# Patient Record
Sex: Male | Born: 1960 | Race: White | Hispanic: No | Marital: Married | Smoking: Never smoker
Health system: Southern US, Community
[De-identification: ages and names within clinical notes are randomized; demographics above are authoritative.]

## PROBLEM LIST (undated history)

## (undated) DIAGNOSIS — R519 Headache, unspecified: Secondary | ICD-10-CM

## (undated) DIAGNOSIS — R51 Headache: Secondary | ICD-10-CM

## (undated) HISTORY — PX: UMBILICAL HERNIA REPAIR: SHX196

## (undated) HISTORY — PX: MICRODISCECTOMY LUMBAR: SUR864

---

## 1998-05-17 ENCOUNTER — Observation Stay (HOSPITAL_COMMUNITY): Admission: RE | Admit: 1998-05-17 | Discharge: 1998-05-17 | Payer: Self-pay | Admitting: Neurosurgery

## 2009-11-26 HISTORY — PX: INGUINAL HERNIA REPAIR: SUR1180

## 2010-02-08 ENCOUNTER — Ambulatory Visit: Payer: Self-pay | Admitting: Surgery

## 2010-02-15 ENCOUNTER — Ambulatory Visit: Payer: Self-pay | Admitting: Surgery

## 2013-04-10 ENCOUNTER — Ambulatory Visit: Payer: Self-pay | Admitting: Emergency Medicine

## 2015-02-15 ENCOUNTER — Ambulatory Visit: Payer: Self-pay | Admitting: Surgery

## 2015-02-24 ENCOUNTER — Ambulatory Visit: Admit: 2015-02-24 | Disposition: A | Discharge status: Home / Self Care | Payer: Self-pay | Admitting: Surgery

## 2015-03-03 ENCOUNTER — Ambulatory Visit
Admit: 2015-03-03 | Disposition: A | Discharge status: Home / Self Care | Payer: Self-pay | Attending: Surgery | Admitting: Surgery

## 2015-03-15 ENCOUNTER — Ambulatory Visit
Admit: 2015-03-15 | Disposition: A | Discharge status: Home / Self Care | Payer: Self-pay | Attending: Internal Medicine | Admitting: Internal Medicine

## 2015-03-21 ENCOUNTER — Ambulatory Visit
Admit: 2015-03-21 | Disposition: A | Discharge status: Home / Self Care | Payer: Self-pay | Attending: Family Medicine | Admitting: Family Medicine

## 2015-03-21 LAB — URINALYSIS, COMPLETE
Bacteria: NEGATIVE
Bilirubin,UR: NEGATIVE
Glucose,UR: NEGATIVE
KETONE: NEGATIVE
NITRITE: NEGATIVE
Ph: 7 (ref 5.0–8.0)
SPECIFIC GRAVITY: 1.02 (ref 1.000–1.030)

## 2015-03-23 LAB — URINE CULTURE

## 2015-03-27 NOTE — H&P (Signed)
PATIENT NAME:  Billy Crosby, Billy Crosby MR#:  [id number] DATE OF BIRTH:  [birth year]  DATE OF ADMISSION:  03/03/2015  CHIEF COMPLAINT: Recurrent ventral hernia.   HISTORY OF PRESENT ILLNESS: This is a patient who had a ventral hernia repaired in the past with Atrium mesh. It has recurred. He has had symptoms for about 2 years with pain and bulge and is requesting repair.   PAST MEDICAL HISTORY: Benign prostatic hypertrophy and allergies.   PAST SURGICAL HISTORY: Right lower hernia repair and umbilical hernia repair.   MEDICATIONS: Multiple, see reconciliation.   ALLERGIES: LEVOFLOXACIN.   SOCIAL HISTORY: The patient rarely drinks alcohol. Does not smoke.   REVIEW OF SYSTEMS: A complete system review is documented in the office chart.   PHYSICAL EXAMINATION:  GENERAL: Healthy-appearing nonobese, male patient.  HEENT: No scleral icterus.  NECK: No palpable neck nodes.  CHEST: Clear to auscultation.  CARDIAC: Regular rate and rhythm.  ABDOMEN: Soft and nontender. There is a scar around the umbilicus and a recurrent hernia cephalad to the umbilicus which is reducible and nontender.  EXTREMITIES: Without edema.  NEUROLOGIC: Grossly intact.  INTEGUMENT: No jaundice.   LABORATORY VALUES: Demonstrate a hemoglobin and hematocrit of 15 and 44 and a platelet count 212,000. Electrolytes are within normal limits.   ASSESSMENT AND PLAN: This is a patient with a recurrent ventral hernia. He is here for repair. The rationale for this has been discussed. The options of observation have been reviewed. I reviewed Dr. Lavone Neri Smith's prior utilizing Atrium mesh, which has obviously recurred. I would plan on possibly removing this mesh versus reinforcing it. The rationale for this has been discussed. The options of observation have been reviewed. The risks of bleeding, infection, mesh placement, mesh infection and bowel injury been reviewed as well. He understood and agreed to proceed.    ____________________________ Jerrol Banana Burt Knack, MD rec:AT D: 03/02/2015 15:03:00 ET T: 03/02/2015 15:39:59 ET JOB#: [id number]  cc: Jerrol Banana. Burt Knack, MD, <Dictator> Florene Glen MD ELECTRONICALLY SIGNED 03/03/2015 7:38

## 2015-03-27 NOTE — Op Note (Signed)
PATIENT NAME:  Billy Crosby, Billy Crosby MR#:  [id number] DATE OF BIRTH:  1961-11-20  DATE OF PROCEDURE:  03/03/2015  PREOPERATIVE DIAGNOSIS: Recurrent ventral hernia.   POSTOPERATIVE DIAGNOSIS:  Recurrent ventral hernia.  PROCEDURE:  Repair of recurrent ventral hernia with mesh.   SURGEON: Richard E. Burt Knack, M.D.   ANESTHESIA: General with endotracheal tube.   INDICATIONS: This patient has had a prior umbilical hernia repaired in the past with mesh. Preoperatively, we discussed rationale for surgery, the risks of bleeding, infection, mesh placement, mesh infection, cosmetic deformity, and recurrence. This was all reviewed for him in the preop holding area. He understood and agreed to proceed.  It was our understanding after reviewing the previous operative report that Atrium mesh had been utilized at the periumbilical site.   FINDINGS: Multiloculated incarcerated omentum in ventral hernia recurrence.  The hernial rent itself measured 2 cm and an 8.3 cm Ventralex ST mesh was utilized in the intraabdominal component.   No bowel was involved in this, but multiloculated incarceration of omentum was present.   DESCRIPTION OF PROCEDURE: The patient was induced under general anesthesia. He was given IV antibiotics. VTE prophylaxis was in place. He was prepped and draped in a sterile fashion. Marcaine was infiltrated in skin and subcutaneous tissues around the palpable and visible incarceration.  A midline incision was utilized to open and explore the subcutaneous tissues.  Large multiloculated sacs were identified. These were opened and cleaned at the fascial edges and ultimately reduced. No mesh was palpable nor visible in this area, but suture was present.  Once the incarceration had been completely reduced and the fascial edges were cleaned, the fascia was noted to be fairly attenuated, but because of the 2 cm rent an 8.3 cm Ventralex ST was placed into the abdominal cavity sutured in with U sutures of 0  Prolene.  Fascia was closed over the mesh after trimming the tails on the mesh. This was done with 0 Prolene as well.  Additional Marcaine was placed for a total of 30 mL, and then the wound was closed in layers, 3 layers of 2-0 and 3-0 Vicryl and then skin staples were placed and a sterile dressing was placed.   The patient tolerated the procedure well. There were no complications. He was taken to the recovery room in stable condition to be discharged to the care of his family. Followup in 10 days. Sponge, lap, needle counts were correct.  Estimated blood loss was nil.   ____________________________ Billy Crosby. Burt Knack, MD rec:sp D: 03/03/2015 10:42:38 ET T: 03/03/2015 13:09:20 ET JOB#: [id number]  cc: Billy Crosby. Burt Knack, MD, <Dictator> Florene Glen MD ELECTRONICALLY SIGNED 03/03/2015 18:41

## 2016-05-05 IMAGING — US US EXTREM LOW VENOUS*L*
1 series · 14 of 24 positions shown · non-contrast
Comparison: None.

CLINICAL DATA: Left calf pain

EXAM:
LEFT LOWER EXTREMITY VENOUS DUPLEX ULTRASOUND
TECHNIQUE: Doppler venous assessment of the left lower extremity deep venous
system was performed, including characterization of spectral flow,
compressibility, and phasicity.

[Series 1: us extrem low venous*left* · 0.09mm/px · 14 of 40 slices shown]
[im 1/40]
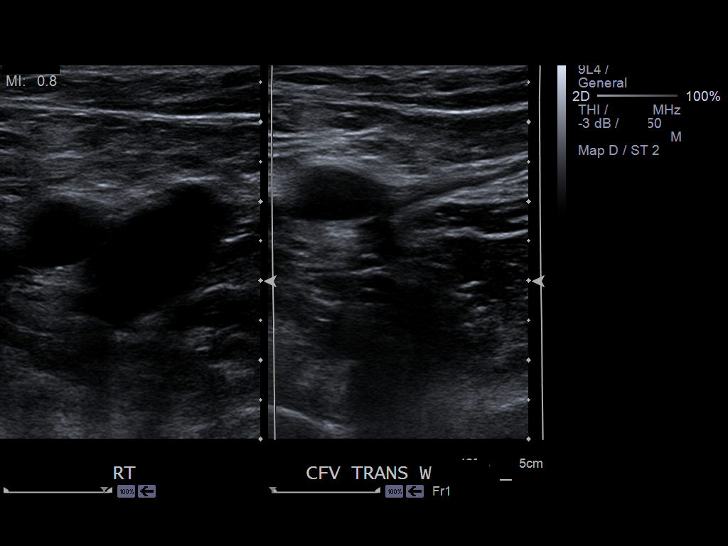
[im 4/40]
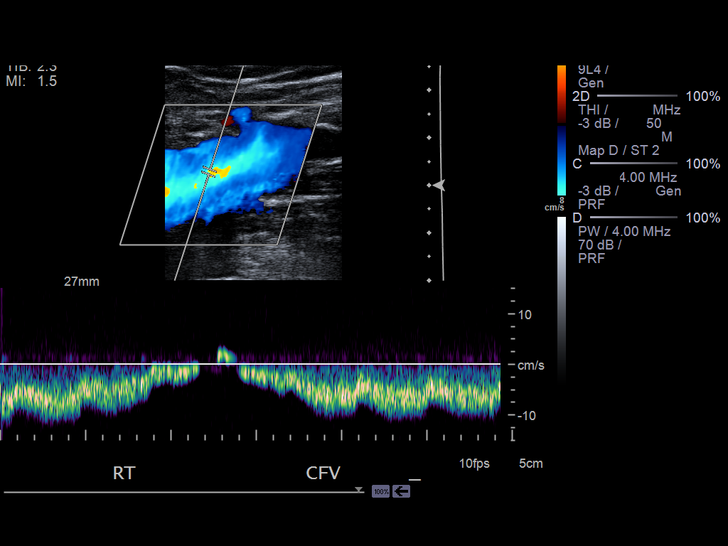
[im 7/40]
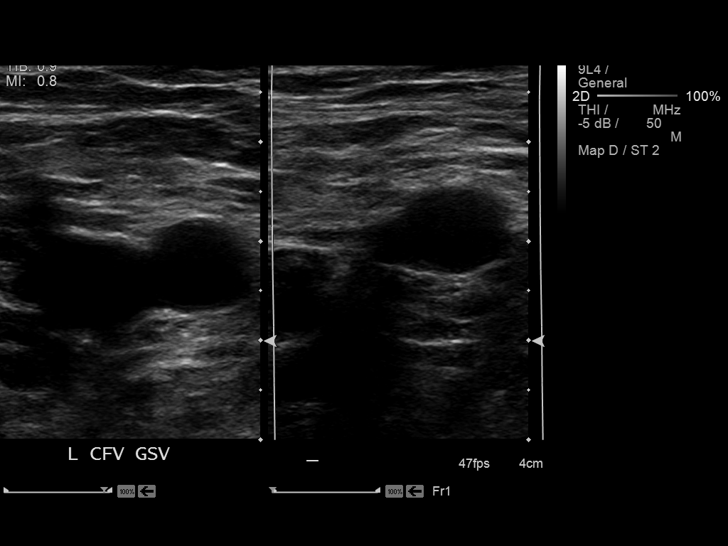
[im 11/40]
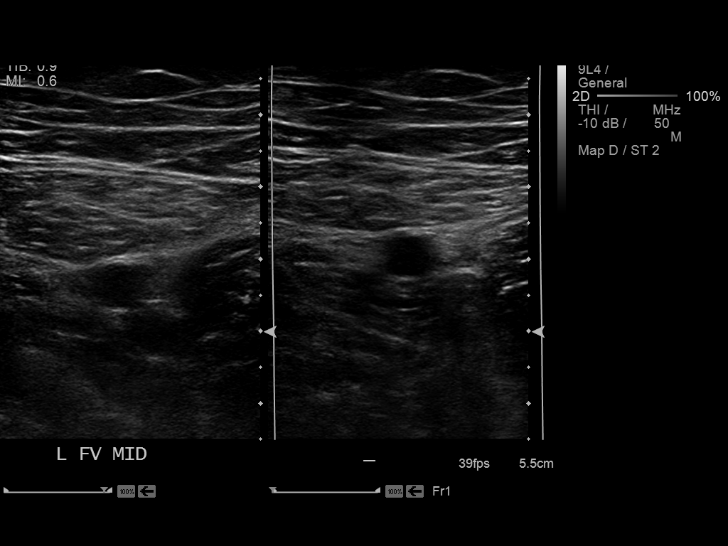
[im 12/40]
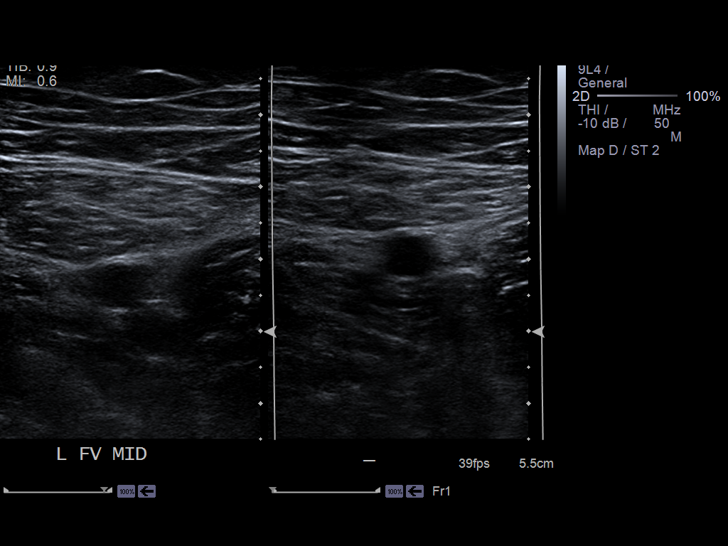
[im 16/40]
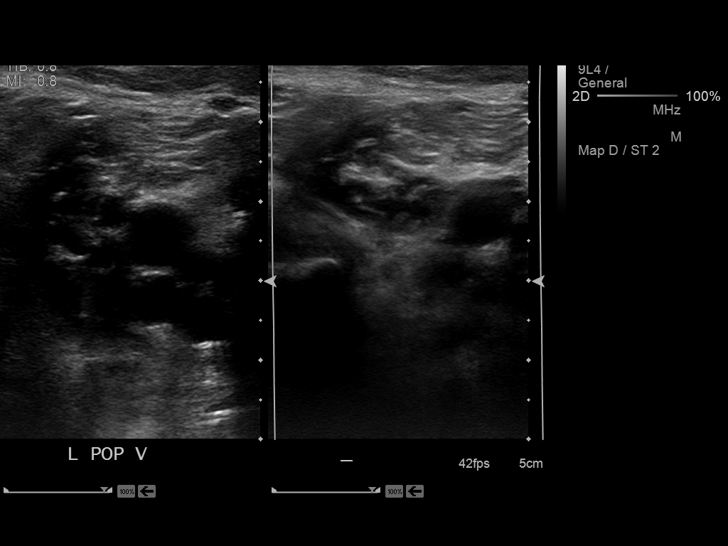
[im 19/40]
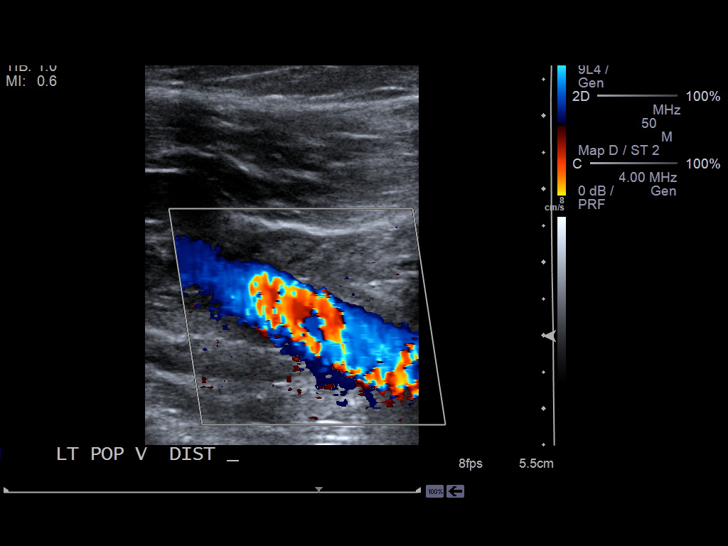
[im 21/40]
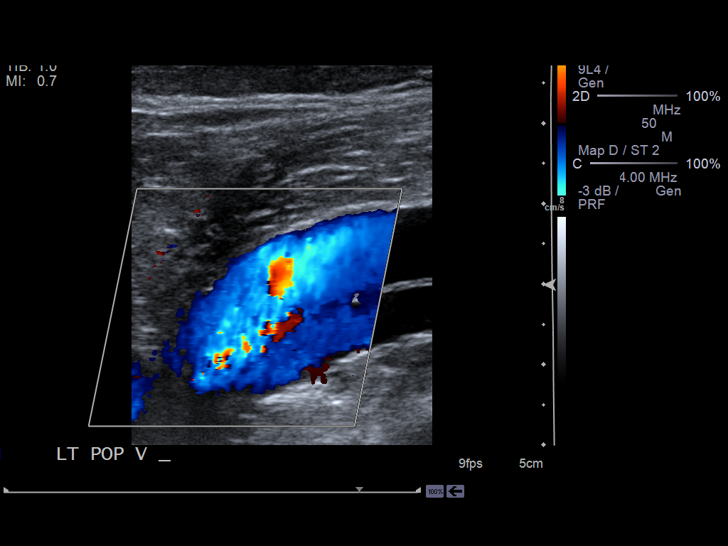
[im 24/40]
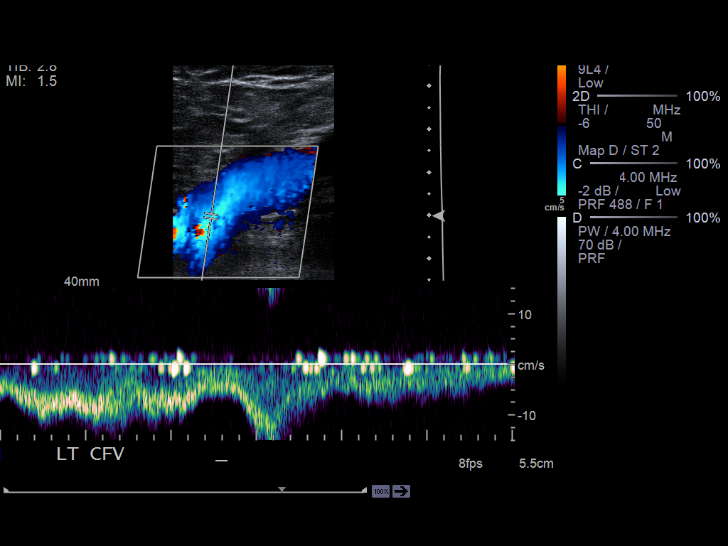
[im 28/40]
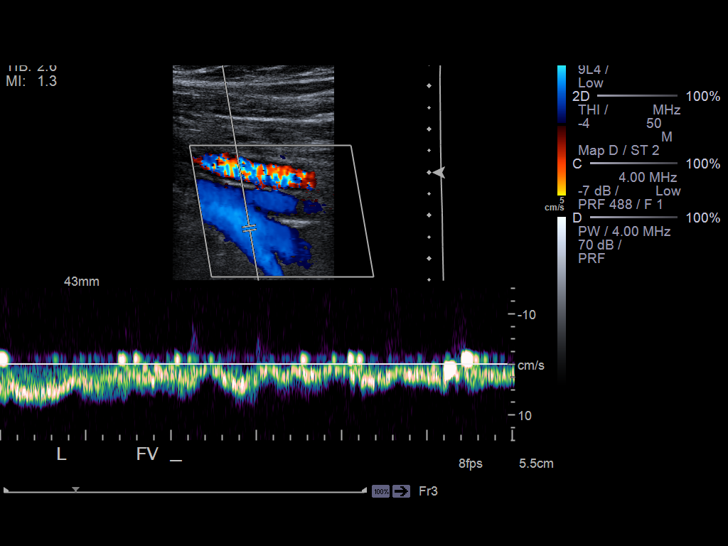
[im 31/40]
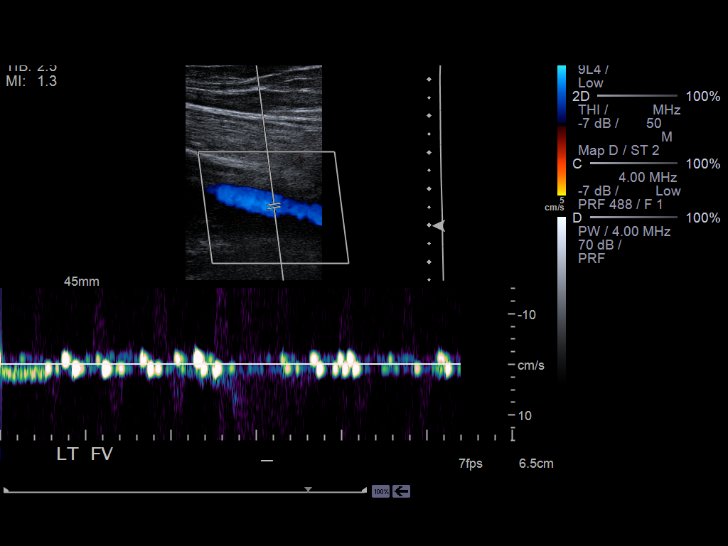
[im 33/40]
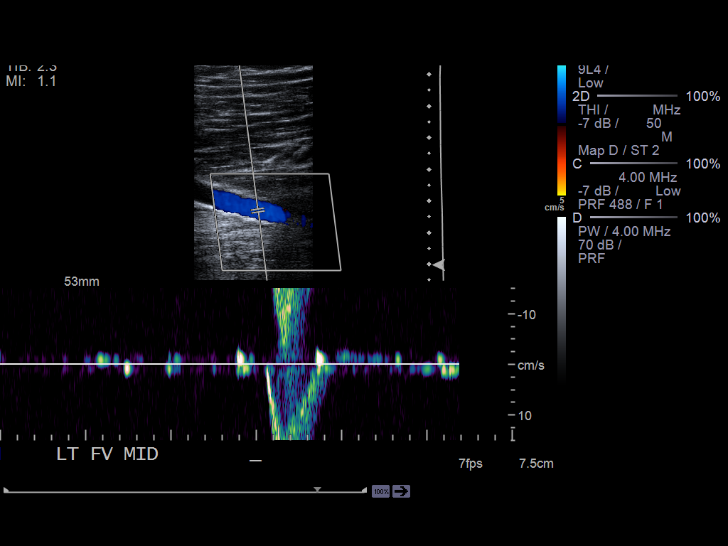
[im 36/40]
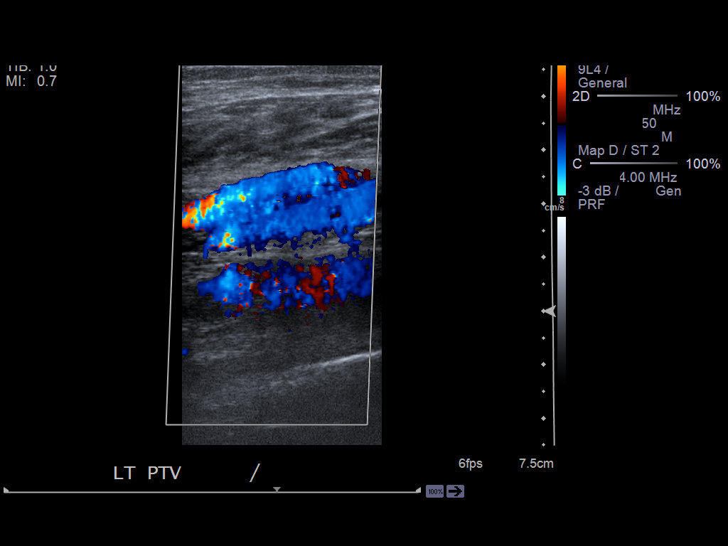
[im 40/40]
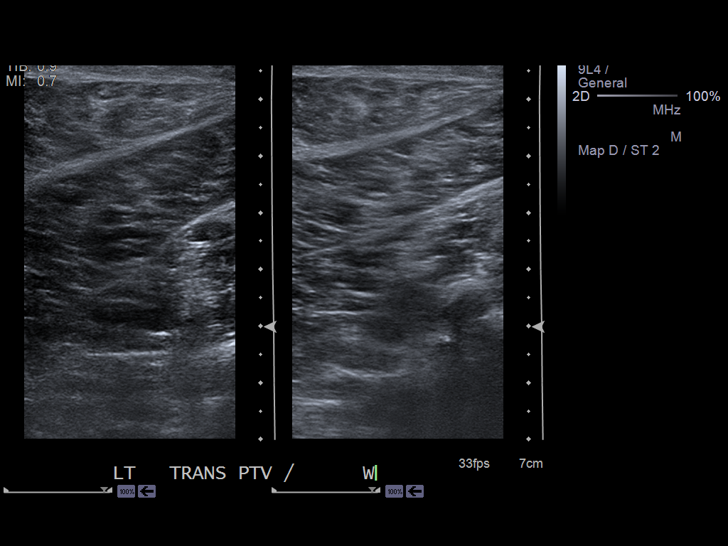

[14 of 24 positions shown; findings below may reference images not displayed]

FINDINGS: There is complete compressibility of the left common femoral,
femoral, and popliteal veins. Doppler analysis demonstrates
phasicity and augmentation. No calf DVT.
IMPRESSION: No evidence of DVT in the left lower extremity.

## 2016-06-26 ENCOUNTER — Telehealth: Payer: Self-pay | Admitting: Gastroenterology

## 2016-06-26 ENCOUNTER — Other Ambulatory Visit: Payer: Self-pay

## 2016-06-26 NOTE — Telephone Encounter (Signed)
Gastroenterology Pre-Procedure Review  Request Date: 08/23/2016 Requesting Physician:   PATIENT REVIEW QUESTIONS: The patient responded to the following health history questions as indicated:    1. Are you having any GI issues? no 2. Do you have a personal history of Polyps? no 3. Do you have a family history of Colon Cancer or Polyps? no 4. Diabetes Mellitus? no 5. Joint replacements in the past 12 months?no 6. Major health problems in the past 3 months?no 7. Any artificial heart valves, MVP, or defibrillator?no    MEDICATIONS & ALLERGIES:    Patient reports the following regarding taking any anticoagulation/antiplatelet therapy:   Plavix, Coumadin, Eliquis, Xarelto, Lovenox, Pradaxa, Brilinta, or Effient? no Aspirin? no  Patient confirms/reports the following medications:  Current Outpatient Prescriptions  Medication Sig Dispense Refill  . fluticasone (FLONASE) 50 MCG/ACT nasal spray Place into both nostrils daily.    Marland Kitchen topiramate (TOPAMAX) 25 MG tablet Take 25 mg by mouth 2 (two) times daily.     No current facility-administered medications for this visit.     Patient confirms/reports the following allergies:  Allergies  Allergen Reactions  . Levaquin [Levofloxacin] Swelling    Tongue and mouth swells     No orders of the defined types were placed in this encounter.   AUTHORIZATION INFORMATION Primary Insurance: 1D#: Group #:  Secondary Insurance: 1D#: Group #:  SCHEDULE INFORMATION: Date: 08/23/2016 Time: Location: [facility]

## 2016-06-26 NOTE — Telephone Encounter (Signed)
colonoscopy

## 2016-06-26 NOTE — Telephone Encounter (Signed)
Screening Colonoscopy Z12.11 [facility] 0000000 Please pre cert

## 2016-08-16 ENCOUNTER — Encounter: Payer: Self-pay | Admitting: *Deleted

## 2016-08-21 NOTE — Discharge Instructions (Signed)

## 2016-08-23 ENCOUNTER — Encounter: Payer: Self-pay | Admitting: *Deleted

## 2016-08-23 ENCOUNTER — Ambulatory Visit
Admission: RE | Admit: 2016-08-23 | Discharge: 2016-08-23 | Disposition: A | Discharge status: Home / Self Care | Payer: Managed Care, Other (non HMO) | Source: Ambulatory Visit | Attending: Gastroenterology | Admitting: Gastroenterology

## 2016-08-23 ENCOUNTER — Ambulatory Visit: Payer: Managed Care, Other (non HMO) | Admitting: Anesthesiology

## 2016-08-23 ENCOUNTER — Encounter
Admission: RE | Disposition: A | Discharge status: Home / Self Care | Payer: Self-pay | Source: Ambulatory Visit | Attending: Gastroenterology

## 2016-08-23 DIAGNOSIS — D123 Benign neoplasm of transverse colon: Secondary | ICD-10-CM

## 2016-08-23 DIAGNOSIS — K648 Other hemorrhoids: Secondary | ICD-10-CM | POA: Diagnosis not present

## 2016-08-23 DIAGNOSIS — Z1211 Encounter for screening for malignant neoplasm of colon: Secondary | ICD-10-CM

## 2016-08-23 HISTORY — PX: POLYPECTOMY: SHX5525

## 2016-08-23 HISTORY — DX: Headache, unspecified: R51.9

## 2016-08-23 HISTORY — DX: Headache: R51

## 2016-08-23 HISTORY — PX: COLONOSCOPY WITH PROPOFOL: SHX5780

## 2016-08-23 SURGERY — COLONOSCOPY WITH PROPOFOL
Anesthesia: Monitor Anesthesia Care | Wound class: Contaminated

## 2016-08-23 MED ORDER — LIDOCAINE HCL (CARDIAC) 20 MG/ML IV SOLN
INTRAVENOUS | Status: DC | PRN
  Administered 2016-08-23: 50 mg via INTRAVENOUS

## 2016-08-23 MED ORDER — OXYCODONE HCL 5 MG PO TABS: 5.0000 mg | ORAL_TABLET | Freq: Once | ORAL | Status: DC | PRN

## 2016-08-23 MED ORDER — PROPOFOL 10 MG/ML IV BOLUS
INTRAVENOUS | Status: DC | PRN
  Administered 2016-08-23: 10 mg via INTRAVENOUS
  Administered 2016-08-23 (×2): 20 mg via INTRAVENOUS
  Administered 2016-08-23: 60 mg via INTRAVENOUS
  Administered 2016-08-23 (×2): 20 mg via INTRAVENOUS

## 2016-08-23 MED ORDER — LACTATED RINGERS IV SOLN
INTRAVENOUS | Status: DC
  Administered 2016-08-23: 07:00:00 via INTRAVENOUS

## 2016-08-23 MED ORDER — OXYCODONE HCL 5 MG/5ML PO SOLN: 5.0000 mg | Freq: Once | ORAL | Status: DC | PRN

## 2016-08-23 MED ORDER — STERILE WATER FOR IRRIGATION IR SOLN
Status: DC | PRN
  Administered 2016-08-23: 08:00:00

## 2016-08-23 SURGICAL SUPPLY — 23 items
CANISTER SUCT 1200ML W/VALVE (MISCELLANEOUS) ×4 IMPLANT
CLIP HMST 235XBRD CATH ROT (MISCELLANEOUS) IMPLANT
CLIP RESOLUTION 360 11X235 (MISCELLANEOUS)
FCP ESCP3.2XJMB 240X2.8X (MISCELLANEOUS)
FORCEPS BIOP RAD 4 LRG CAP 4 (CUTTING FORCEPS) ×2 IMPLANT
FORCEPS BIOP RJ4 240 W/NDL (MISCELLANEOUS)
FORCEPS ESCP3.2XJMB 240X2.8X (MISCELLANEOUS) IMPLANT
GOWN CVR UNV OPN BCK APRN NK (MISCELLANEOUS) ×4 IMPLANT
GOWN ISOL THUMB LOOP REG UNIV (MISCELLANEOUS) ×8
INJECTOR VARIJECT VIN23 (MISCELLANEOUS) IMPLANT
KIT DEFENDO VALVE AND CONN (KITS) IMPLANT
KIT ENDO PROCEDURE OLY (KITS) ×4 IMPLANT
MARKER SPOT ENDO TATTOO 5ML (MISCELLANEOUS) IMPLANT
PAD GROUND ADULT SPLIT (MISCELLANEOUS) IMPLANT
PROBE APC STR FIRE (PROBE) IMPLANT
RETRIEVER NET ROTH 2.5X230 LF (MISCELLANEOUS) IMPLANT
SNARE SHORT THROW 13M SML OVAL (MISCELLANEOUS) IMPLANT
SNARE SHORT THROW 30M LRG OVAL (MISCELLANEOUS) IMPLANT
SNARE SNG USE RND 15MM (INSTRUMENTS) IMPLANT
SPOT EX ENDOSCOPIC TATTOO (MISCELLANEOUS)
TRAP ETRAP POLY (MISCELLANEOUS) IMPLANT
VARIJECT INJECTOR VIN23 (MISCELLANEOUS)
WATER STERILE IRR 250ML POUR (IV SOLUTION) ×4 IMPLANT

## 2016-08-23 NOTE — Op Note (Signed)
[facility]astroenterology Patient Name: Billy Crosby Procedure Date: 08/23/2016 7:55 AM MRN: [id number] Account #: [id number] Date of Birth: 22-Aug-1961 Admit Type: Outpatient Age: 55 Room: [facility] OR ROOM 01 Gender: Male Note Status: Finalized Procedure:            Colonoscopy Indications:          Screening for colorectal malignant neoplasm Providers:            Lucilla Lame MD, MD Referring MD:         Leona Carry. Hall Busing, MD (Referring MD) Medicines:            Propofol per Anesthesia Complications:        No immediate complications. Procedure:            Pre-Anesthesia Assessment:                       - Prior to the procedure, a History and Physical was                        performed, and patient medications and allergies were                        reviewed. The patient's tolerance of previous                        anesthesia was also reviewed. The risks and benefits of                        the procedure and the sedation options and risks were                        discussed with the patient. All questions were                        answered, and informed consent was obtained. Prior                        Anticoagulants: The patient has taken no previous                        anticoagulant or antiplatelet agents. ASA Grade                        Assessment: II - A patient with mild systemic disease.                        After reviewing the risks and benefits, the patient was                        deemed in satisfactory condition to undergo the                        procedure.                       After obtaining informed consent, the colonoscope was                        passed under direct vision. Throughout the procedure,  the patient's blood pressure, pulse, and oxygen                        saturations were monitored continuously. The was                        introduced through the anus and advanced to the the                         cecum, identified by appendiceal orifice and ileocecal                        valve. The colonoscopy was performed without                        difficulty. The patient tolerated the procedure well.                        The quality of the bowel preparation was good. Findings:      The perianal and digital rectal examinations were normal.      A 4 mm polyp was found in the transverse colon. The polyp was sessile.       The polyp was removed with a cold biopsy forceps. Resection and       retrieval were complete.      Non-bleeding internal hemorrhoids were found during retroflexion. The       hemorrhoids were Grade I (internal hemorrhoids that do not prolapse). Impression:           - One 4 mm polyp in the transverse colon, removed with                        a cold biopsy forceps. Resected and retrieved.                       - Non-bleeding internal hemorrhoids. Recommendation:       - Await pathology results.                       - Discharge patient to home.                       - Resume previous diet.                       - Continue present medications. Procedure Code(s):    --- Professional ---                       [phone], Colonoscopy, flexible; with biopsy, single or                        multiple Diagnosis Code(s):    --- Professional ---                       Z12.11, Encounter for screening for malignant neoplasm                        of colon                       D12.3, Benign neoplasm of transverse colon (hepatic  flexure or splenic flexure) CPT copyright 2016 American Medical Association. All rights reserved. The codes documented in this report are preliminary and upon coder review may  be revised to meet current compliance requirements. Lucilla Lame MD, MD 08/23/2016 8:15:30 AM This report has been signed electronically. Number of Addenda: 0 Note Initiated On: 08/23/2016 7:55 AM Scope Withdrawal Time: 0 hours 8 minutes 28 seconds   Total Procedure Duration: 0 hours 10 minutes 40 seconds       [facility]

## 2016-08-23 NOTE — H&P (Signed)
  Billy Lame, MD [facility] [address], [place], [place] [address] Phone: [phone] Fax : [phone]  Primary Care Physician:  Albina Billet, MD Primary Gastroenterologist:  Dr. Allen Norris  Pre-Procedure History & Physical: HPI:  Billy Crosby is a 55 y.o. male is here for a screening colonoscopy.   Past Medical History:  Diagnosis Date  . Headache    Tension/sinus/stress 6-7x/wk    Past Surgical History:  Procedure Laterality Date  . INGUINAL HERNIA REPAIR Right 2011  . MICRODISCECTOMY LUMBAR  1990s   L4-L5 (x2)  . UMBILICAL HERNIA REPAIR  2011, 2016    Prior to Admission medications   Medication Sig Start Date End Date Taking? Authorizing Provider  azelastine (ASTELIN) 0.1 % nasal spray Place 1 spray into both nostrils 2 (two) times daily. Use in each nostril as directed   Yes Historical Provider, MD  Cholecalciferol (VITAMIN D3 PO) Take by mouth.   Yes Historical Provider, MD  fluticasone (FLONASE) 50 MCG/ACT nasal spray Place into both nostrils daily.   Yes Historical Provider, MD  GARLIC OIL PO Take by mouth.   Yes Historical Provider, MD  Menaquinone-7 (VITAMIN K2 PO) Take by mouth.   Yes Historical Provider, MD  NON FORMULARY "Skull cap" (Scutellarai Lateriaflora) for headaches PRN   Yes Historical Provider, MD  OIL OF OREGANO PO Take by mouth.   Yes Historical Provider, MD  Probiotic Product (PROBIOTIC PO) Take by mouth.   Yes Historical Provider, MD  topiramate (TOPAMAX) 25 MG tablet Take 25 mg by mouth 2 (two) times daily.   Yes Historical Provider, MD    Allergies as of 06/26/2016 - never reviewed  Allergen Reaction Noted  . Levaquin [levofloxacin] Swelling 06/26/2016    History reviewed. No pertinent family history.  Social History   Social History  . Marital status: Married    Spouse name: N/A  . Number of children: N/A  . Years of education: N/A   Occupational History  . Not on file.   Social History Main Topics  . Smoking status:  Never Smoker  . Smokeless tobacco: Never Used  . Alcohol use Yes     Comment: Holidays  . Drug use: Unknown  . Sexual activity: Not on file   Other Topics Concern  . Not on file   Social History Narrative  . No narrative on file    Review of Systems: See HPI, otherwise negative ROS  Physical Exam: BP 120/82   Pulse 93   Temp 97.5 F (36.4 C)   Resp 16   Ht 5\' 11"  (1.803 m)   Wt 220 lb (99.8 kg)   SpO2 98%   BMI 30.68 kg/m  General:   Alert,  pleasant and cooperative in NAD Head:  Normocephalic and atraumatic. Neck:  Supple; no masses or thyromegaly. Lungs:  Clear throughout to auscultation.    Heart:  Regular rate and rhythm. Abdomen:  Soft, nontender and nondistended. Normal bowel sounds, without guarding, and without rebound.   Neurologic:  Alert and  oriented x4;  grossly normal neurologically.  Impression/Plan: Billy Crosby is now here to undergo a screening colonoscopy.  Risks, benefits, and alternatives regarding colonoscopy have been reviewed with the patient.  Questions have been answered.  All parties agreeable.

## 2016-08-23 NOTE — Transfer of Care (Signed)
Immediate Anesthesia Transfer of Care Note  Patient: Billy Crosby  Procedure(s) Performed: Procedure(s) with comments: COLONOSCOPY WITH PROPOFOL (N/A) - requests early POLYPECTOMY  Patient Location: PACU  Anesthesia Type: MAC  Level of Consciousness: awake, alert  and patient cooperative  Airway and Oxygen Therapy: Patient Spontanous Breathing and Patient connected to supplemental oxygen  Post-op Assessment: Post-op Vital signs reviewed, Patient's Cardiovascular Status Stable, Respiratory Function Stable, Patent Airway and No signs of Nausea or vomiting  Post-op Vital Signs: Reviewed and stable  Complications: No apparent anesthesia complications

## 2016-08-23 NOTE — Anesthesia Postprocedure Evaluation (Signed)
Anesthesia Post Note  Patient: Billy Crosby  Procedure(s) Performed: Procedure(s) (LRB): COLONOSCOPY WITH PROPOFOL (N/A) POLYPECTOMY  Patient location during evaluation: PACU Anesthesia Type: MAC Level of consciousness: awake and alert Pain management: pain level controlled Vital Signs Assessment: post-procedure vital signs reviewed and stable Respiratory status: spontaneous breathing, nonlabored ventilation, respiratory function stable and patient connected to nasal cannula oxygen Cardiovascular status: stable and blood pressure returned to baseline Anesthetic complications: no    Nautia Lem

## 2016-08-23 NOTE — Anesthesia Procedure Notes (Signed)
Procedure Name: MAC Performed by: Monserratt Knezevic Pre-anesthesia Checklist: Patient identified, Emergency Drugs available, Suction available, Timeout performed and Patient being monitored Patient Re-evaluated:Patient Re-evaluated prior to inductionOxygen Delivery Method: Nasal cannula Placement Confirmation: positive ETCO2       

## 2016-08-23 NOTE — Anesthesia Preprocedure Evaluation (Signed)
Anesthesia Evaluation  Patient identified by MRN, date of birth, ID band  Reviewed: NPO status   History of Anesthesia Complications Negative for: history of anesthetic complications  Airway Mallampati: II  TM Distance: >3 FB Neck ROM: full    Dental  (+) Chipped,    Pulmonary neg pulmonary ROS,    Pulmonary exam normal        Cardiovascular Exercise Tolerance: Good negative cardio ROS Normal cardiovascular exam     Neuro/Psych  Headaches, negative psych ROS   GI/Hepatic negative GI ROS, Neg liver ROS,   Endo/Other  negative endocrine ROS  Renal/GU negative Renal ROS  negative genitourinary   Musculoskeletal   Abdominal   Peds  Hematology negative hematology ROS (+)   Anesthesia Other Findings   Reproductive/Obstetrics                             Anesthesia Physical Anesthesia Plan  ASA: II  Anesthesia Plan: MAC   Post-op Pain Management:    Induction:   Airway Management Planned:   Additional Equipment:   Intra-op Plan:   Post-operative Plan:   Informed Consent: I have reviewed the patients History and Physical, chart, labs and discussed the procedure including the risks, benefits and alternatives for the proposed anesthesia with the patient or authorized representative who has indicated his/her understanding and acceptance.     Plan Discussed with: CRNA  Anesthesia Plan Comments:         Anesthesia Quick Evaluation

## 2016-08-24 ENCOUNTER — Encounter: Payer: Self-pay | Admitting: Gastroenterology

## 2016-08-28 ENCOUNTER — Encounter: Payer: Self-pay | Admitting: Gastroenterology

## 2016-09-04 ENCOUNTER — Encounter: Payer: Self-pay | Admitting: Gastroenterology

## 2017-04-03 ENCOUNTER — Encounter: Payer: Self-pay | Admitting: *Deleted

## 2017-04-03 ENCOUNTER — Ambulatory Visit
Admission: EM | Admit: 2017-04-03 | Discharge: 2017-04-03 | Disposition: A | Discharge status: Home / Self Care | Payer: Managed Care, Other (non HMO) | Attending: Family Medicine | Admitting: Family Medicine

## 2017-04-03 DIAGNOSIS — G4489 Other headache syndrome: Secondary | ICD-10-CM

## 2017-04-03 DIAGNOSIS — R112 Nausea with vomiting, unspecified: Secondary | ICD-10-CM

## 2017-04-03 DIAGNOSIS — R110 Nausea: Secondary | ICD-10-CM

## 2017-04-03 MED ORDER — KETOROLAC TROMETHAMINE 60 MG/2ML IM SOLN
60.0000 mg | Freq: Once | INTRAMUSCULAR | Status: AC
  Administered 2017-04-03: 60 mg via INTRAMUSCULAR

## 2017-04-03 MED ORDER — ONDANSETRON 8 MG PO TBDP: 8.0000 mg | ORAL_TABLET | Freq: Three times a day (TID) | ORAL | 0 refills | Status: DC | PRN

## 2017-04-03 MED ORDER — ONDANSETRON 8 MG PO TBDP
8.0000 mg | ORAL_TABLET | Freq: Once | ORAL | Status: AC
  Administered 2017-04-03: 8 mg via ORAL

## 2017-04-03 NOTE — ED Provider Notes (Signed)
[facility]    CSN: [id number] Arrival date & time: 04/03/17  1333     History   Chief Complaint Chief Complaint  Patient presents with  . Headache  . Nausea  . Emesis    HPI Billy Crosby is a 56 y.o. male.   56 yo male with a c/o headache for 2 days, left temporal associated with nausea and vomiting. Denies any fevers, chills, vision changes, diarrhea, photophobia. States has a h/o chronic headaches.    The history is provided by the patient.    Past Medical History:  Diagnosis Date  . Headache    Tension/sinus/stress 6-7x/wk    Patient Active Problem List   Diagnosis Date Noted  . Special screening for malignant neoplasms, colon   . Benign neoplasm of transverse colon     Past Surgical History:  Procedure Laterality Date  . COLONOSCOPY WITH PROPOFOL N/A 08/23/2016   Procedure: COLONOSCOPY WITH PROPOFOL;  Surgeon: Lucilla Lame, MD;  Location: [place];  Service: Endoscopy;  Laterality: N/A;  requests early  . INGUINAL HERNIA REPAIR Right 2011  . MICRODISCECTOMY LUMBAR  1990s   L4-L5 (x2)  . POLYPECTOMY  08/23/2016   Procedure: POLYPECTOMY;  Surgeon: Lucilla Lame, MD;  Location: [place];  Service: Endoscopy;;  . UMBILICAL HERNIA REPAIR  2011, 2016       Home Medications    Prior to Admission medications   Medication Sig Start Date End Date Taking? Authorizing Provider  azelastine (ASTELIN) 0.1 % nasal spray Place 1 spray into both nostrils 2 (two) times daily. Use in each nostril as directed    [provider]  Cholecalciferol (VITAMIN D3 PO) Take by mouth.    [provider]  fluticasone (FLONASE) 50 MCG/ACT nasal spray Place into both nostrils daily.    [provider]  GARLIC OIL PO Take by mouth.    [provider]  Menaquinone-7 (VITAMIN K2 PO) Take by mouth.    [provider]  NON FORMULARY "Skull cap" (Scutellarai Lateriaflora) for headaches PRN    [provider]  OIL OF OREGANO PO Take by mouth.    [provider]  ondansetron (ZOFRAN ODT) 8 MG disintegrating tablet Take 1 tablet (8 mg total) by mouth every 8 (eight) hours as needed. 04/03/17   Norval Gable, MD  Probiotic Product (PROBIOTIC PO) Take by mouth.    [provider]  topiramate (TOPAMAX) 25 MG tablet Take 25 mg by mouth 2 (two) times daily.    [provider]    Family History History reviewed. No pertinent family history.  Social History Social History  Substance Use Topics  . Smoking status: Never Smoker  . Smokeless tobacco: Never Used  . Alcohol use Yes     Comment: Holidays     Allergies   Levaquin [levofloxacin]   Review of Systems Review of Systems   Physical Exam Triage Vital Signs ED Triage Vitals  Enc Vitals Group     BP 04/03/17 1408 126/64     Pulse Rate 04/03/17 1408 83     Resp 04/03/17 1408 16     Temp 04/03/17 1408 99.2 F (37.3 C)     Temp Source 04/03/17 1408 Oral     SpO2 04/03/17 1408 100 %     Weight 04/03/17 1410 200 lb (90.7 kg)     Height 04/03/17 1410 5\' 11"  (1.803 m)     Head Circumference --      Peak  Flow --      Pain Score --      Pain Loc --      Pain Edu? --      Excl. in [place]? --    No data found.   Updated Vital Signs BP 126/64 (BP Location: Left Arm)   Pulse 83   Temp 99.2 F (37.3 C) (Oral)   Resp 16   Ht 5\' 11"  (1.803 m)   Wt 200 lb (90.7 kg)   SpO2 100%   BMI 27.89 kg/m   Visual Acuity Right Eye Distance:   Left Eye Distance:   Bilateral Distance:    Right Eye Near:   Left Eye Near:    Bilateral Near:     Physical Exam  Constitutional: He is oriented to person, place, and time. He appears well-developed and well-nourished. No distress.  HENT:  Head: Normocephalic and atraumatic.  Right Ear: Tympanic membrane, external ear and ear canal normal.  Left Ear: Tympanic membrane, external ear and ear canal normal.  Nose: Nose normal.  Mouth/Throat: Uvula is midline,  oropharynx is clear and moist and mucous membranes are normal. No oropharyngeal exudate or tonsillar abscesses.  Eyes: Conjunctivae and EOM are normal. Pupils are equal, round, and reactive to light. Right eye exhibits no discharge. Left eye exhibits no discharge. No scleral icterus.  Neck: Normal range of motion. Neck supple. No tracheal deviation present. No thyromegaly present.  Cardiovascular: Normal rate, regular rhythm and normal heart sounds.   Pulmonary/Chest: Effort normal and breath sounds normal. No stridor. No respiratory distress. He has no wheezes. He has no rales. He exhibits no tenderness.  Lymphadenopathy:    He has no cervical adenopathy.  Neurological: He is alert and oriented to person, place, and time. He displays normal reflexes. No cranial nerve deficit or sensory deficit. He exhibits normal muscle tone. Coordination normal.  Skin: Skin is warm and dry. No rash noted. He is not diaphoretic.  Nursing note and vitals reviewed.    UC Treatments / Results  Labs (all labs ordered are listed, but only abnormal results are displayed) Labs Reviewed - No data to display  EKG  EKG Interpretation None       Radiology No results found.  Procedures Procedures (including critical care time)  Medications Ordered in UC Medications  ondansetron (ZOFRAN-ODT) disintegrating tablet 8 mg (8 mg Oral Given 04/03/17 1447)  ketorolac (TORADOL) injection 60 mg (60 mg Intramuscular Given 04/03/17 1448)     Initial Impression / Assessment and Plan / UC Course  I have reviewed the triage vital signs and the nursing notes.  Pertinent labs & imaging results that were available during my care of the patient were reviewed by me and considered in my medical decision making (see chart for details).       Final Clinical Impressions(s) / UC Diagnoses   Final diagnoses:  Other headache syndrome  Nausea    New Prescriptions Discharge Medication List as of 04/03/2017  2:59 PM      START taking these medications   Details  ondansetron (ZOFRAN ODT) 8 MG disintegrating tablet Take 1 tablet (8 mg total) by mouth every 8 (eight) hours as needed., Starting Wed 04/03/2017, Normal       1.diagnosis reviewed with patient 2. Given toradol 60mg  im x1 and zofran 8mg  odt with improvement of symptoms 3.  rx as per orders above; reviewed possible side effects, interactions, risks and benefits  4. Follow-up prn if symptoms worsen or don't improve  Norval Gable, MD 04/03/17 [phone]

## 2017-04-03 NOTE — ED Triage Notes (Signed)
Headache x2 days with N/V. Hx of headaches.

## 2020-08-04 ENCOUNTER — Other Ambulatory Visit: Payer: Self-pay

## 2020-08-04 ENCOUNTER — Emergency Department: Payer: PRIVATE HEALTH INSURANCE

## 2020-08-04 DIAGNOSIS — Z79899 Other long term (current) drug therapy: Secondary | ICD-10-CM | POA: Insufficient documentation

## 2020-08-04 DIAGNOSIS — J1282 Pneumonia due to coronavirus disease 2019: Secondary | ICD-10-CM | POA: Insufficient documentation

## 2020-08-04 DIAGNOSIS — R509 Fever, unspecified: Secondary | ICD-10-CM | POA: Diagnosis present

## 2020-08-04 DIAGNOSIS — K8020 Calculus of gallbladder without cholecystitis without obstruction: Secondary | ICD-10-CM | POA: Diagnosis not present

## 2020-08-04 DIAGNOSIS — U071 COVID-19: Secondary | ICD-10-CM | POA: Insufficient documentation

## 2020-08-04 LAB — COMPREHENSIVE METABOLIC PANEL
ALT: 88 U/L — ABNORMAL HIGH (ref 0–44)
AST: 103 U/L — ABNORMAL HIGH (ref 15–41)
Albumin: 4 g/dL (ref 3.5–5.0)
Alkaline Phosphatase: 52 U/L (ref 38–126)
Anion gap: 9 (ref 5–15)
BUN: 19 mg/dL (ref 6–20)
CO2: 27 mmol/L (ref 22–32)
Calcium: 8.5 mg/dL — ABNORMAL LOW (ref 8.9–10.3)
Chloride: 101 mmol/L (ref 98–111)
Creatinine, Ser: 1.07 mg/dL (ref 0.61–1.24)
GFR calc Af Amer: >60 mL/min (ref >60)
GFR calc non Af Amer: >60 mL/min (ref >60)
Glucose, Bld: 118 mg/dL — ABNORMAL HIGH (ref 70–99)
Potassium: 4.6 mmol/L (ref 3.5–5.1)
Sodium: 137 mmol/L (ref 135–145)
Total Bilirubin: 1.2 mg/dL (ref 0.3–1.2)
Total Protein: 7.3 g/dL (ref 6.5–8.1)

## 2020-08-04 LAB — CBC
HCT: 44.6 % (ref 39.0–52.0)
Hemoglobin: 15.4 g/dL (ref 13.0–17.0)
MCH: 32.5 pg (ref 26.0–34.0)
MCHC: 34.5 g/dL (ref 30.0–36.0)
MCV: 94.1 fL (ref 80.0–100.0)
Platelets: 111 10*3/uL — ABNORMAL LOW (ref 150–400)
RBC: 4.74 MIL/uL (ref 4.22–5.81)
RDW: 13.2 % (ref 11.5–15.5)
WBC: 3.3 10*3/uL — ABNORMAL LOW (ref 4.0–10.5)
nRBC: 0 % (ref 0.0–0.2)

## 2020-08-04 LAB — LIPASE, BLOOD: Lipase: 90 U/L — ABNORMAL HIGH (ref 11–51)

## 2020-08-04 MED ORDER — ACETAMINOPHEN 500 MG PO TABS
ORAL_TABLET | ORAL | Status: AC
  Filled 2020-08-04: qty 2

## 2020-08-04 MED ORDER — ACETAMINOPHEN 325 MG PO TABS
ORAL_TABLET | ORAL | Status: AC
  Administered 2020-08-04: 650 mg via ORAL
  Filled 2020-08-04: qty 2

## 2020-08-04 MED ORDER — ACETAMINOPHEN 325 MG PO TABS: 650.0000 mg | ORAL_TABLET | Freq: Once | ORAL | Status: AC

## 2020-08-04 NOTE — ED Triage Notes (Signed)
Pt comes to ED due to abdominal pain, nausea, decreased appetite, HA, fatigue, and states his wife is COVID (+). Pt states he has experienced these symptoms for 7 days and has gotten worse. Reports he cannot eat or drink, has attempted to drink 1 ensure a day but has had difficulty with this. Pt ambulates to triage room and denies SOB.

## 2020-08-05 ENCOUNTER — Telehealth: Payer: Self-pay | Admitting: Physician Assistant

## 2020-08-05 ENCOUNTER — Emergency Department: Payer: PRIVATE HEALTH INSURANCE

## 2020-08-05 ENCOUNTER — Other Ambulatory Visit: Payer: Self-pay | Admitting: Physician Assistant

## 2020-08-05 ENCOUNTER — Emergency Department
Admission: EM | Admit: 2020-08-05 | Discharge: 2020-08-05 | Disposition: A | Discharge status: Home / Self Care | Payer: PRIVATE HEALTH INSURANCE | Attending: Emergency Medicine | Admitting: Emergency Medicine

## 2020-08-05 DIAGNOSIS — E663 Overweight: Secondary | ICD-10-CM

## 2020-08-05 DIAGNOSIS — R509 Fever, unspecified: Secondary | ICD-10-CM

## 2020-08-05 DIAGNOSIS — U071 COVID-19: Secondary | ICD-10-CM

## 2020-08-05 DIAGNOSIS — K8020 Calculus of gallbladder without cholecystitis without obstruction: Secondary | ICD-10-CM

## 2020-08-05 DIAGNOSIS — R110 Nausea: Secondary | ICD-10-CM

## 2020-08-05 DIAGNOSIS — J1282 Pneumonia due to coronavirus disease 2019: Secondary | ICD-10-CM

## 2020-08-05 LAB — SARS CORONAVIRUS 2 BY RT PCR (HOSPITAL ORDER, PERFORMED IN ~~LOC~~ HOSPITAL LAB): SARS Coronavirus 2: POSITIVE — AB

## 2020-08-05 MED ORDER — ONDANSETRON 4 MG PO TBDP: 4.0000 mg | ORAL_TABLET | Freq: Three times a day (TID) | ORAL | 0 refills | Status: AC | PRN

## 2020-08-05 MED ORDER — HYDROCOD POLST-CPM POLST ER 10-8 MG/5ML PO SUER
5.0000 mL | Freq: Two times a day (BID) | ORAL | 0 refills | Status: DC
Start: 2020-08-05 — End: 2021-09-06

## 2020-08-05 MED ORDER — DEXAMETHASONE SODIUM PHOSPHATE 10 MG/ML IJ SOLN
6.0000 mg | Freq: Once | INTRAMUSCULAR | Status: AC
  Administered 2020-08-05: 6 mg via INTRAVENOUS
  Filled 2020-08-05: qty 1

## 2020-08-05 MED ORDER — ONDANSETRON HCL 4 MG/2ML IJ SOLN
4.0000 mg | Freq: Once | INTRAMUSCULAR | Status: AC
  Administered 2020-08-05: 4 mg via INTRAVENOUS
  Filled 2020-08-05: qty 2

## 2020-08-05 MED ORDER — ALBUTEROL SULFATE HFA 108 (90 BASE) MCG/ACT IN AERS: 2.0000 | INHALATION_SPRAY | RESPIRATORY_TRACT | 0 refills | Status: DC | PRN

## 2020-08-05 MED ORDER — SODIUM CHLORIDE 0.9 % IV BOLUS
1000.0000 mL | Freq: Once | INTRAVENOUS | Status: AC
  Administered 2020-08-05: 1000 mL via INTRAVENOUS

## 2020-08-05 MED ORDER — OXYCODONE-ACETAMINOPHEN 5-325 MG PO TABS: 1.0000 | ORAL_TABLET | ORAL | 0 refills | Status: DC | PRN

## 2020-08-05 NOTE — ED Notes (Signed)
Ambulated patient beginning, HR - 112, O2- 98, middle HR- 110, O2- 96, ending walk HR 105, O2- 94.AS

## 2020-08-05 NOTE — Progress Notes (Signed)
I connected by phone with Billy Crosby on 08/05/2020 at 11:44 AM to discuss the potential use of a new treatment for mild to moderate COVID-19 viral infection in non-hospitalized patients.  This patient is a 59 y.o. male that meets the FDA criteria for Emergency Use Authorization of COVID monoclonal antibody casirivimab/imdevimab.  Has a (+) direct SARS-CoV-2 viral test result  Has mild or moderate COVID-19   Is NOT hospitalized due to COVID-19  Is within 10 days of symptom onset  Has at least one of the high risk factor(s) for progression to severe COVID-19 and/or hospitalization as defined in EUA.  Specific high risk criteria : BMI > 25   I have spoken and communicated the following to the patient or parent/caregiver regarding COVID monoclonal antibody treatment:  1. FDA has authorized the emergency use for the treatment of mild to moderate COVID-19 in adults and pediatric patients with positive results of direct SARS-CoV-2 viral testing who are 8 years of age and older weighing at least 40 kg, and who are at high risk for progressing to severe COVID-19 and/or hospitalization.  2. The significant known and potential risks and benefits of COVID monoclonal antibody, and the extent to which such potential risks and benefits are unknown.  3. Information on available alternative treatments and the risks and benefits of those alternatives, including clinical trials.  4. Patients treated with COVID monoclonal antibody should continue to self-isolate and use infection control measures (e.g., wear mask, isolate, social distance, avoid sharing personal items, clean and disinfect "high touch" surfaces, and frequent handwashing) according to CDC guidelines.   5. The patient or parent/caregiver has the option to accept or refuse COVID monoclonal antibody treatment.  After reviewing this information with the patient, The patient agreed to proceed with receiving casirivimab\imdevimab  infusion and will be provided a copy of the Fact sheet prior to receiving the infusion.   [name] 08/05/2020 11:44 AM

## 2020-08-05 NOTE — Telephone Encounter (Signed)
Called to discuss with Bobette Mo about Covid symptoms and the use of casirivimab and imdevimab, a monoclonal antibody infusion for those with mild to moderate Covid symptoms and at a high risk of hospitalization.     Pt is qualified for this infusion at the [facility] hospital due to co-morbid conditions and/or a member of an at-risk group, however declines infusion at this time. Symptoms tier reviewed as well as criteria for ending isolation.  Symptoms reviewed that would warrant ED/Hospital evaluation. Preventative practices reviewed. Patient verbalized understanding. Patient advised to call back if he decides that he does want to get infusion. Callback number to the infusion center given. Patient advised to go to Urgent care or ED with severe symptoms.   BMI 29.98 Last day to receive tx 08/06/09.  Patient does not think he can drive to [place]. [place] information given.   Leanor Kail, [place]

## 2020-08-05 NOTE — ED Provider Notes (Signed)
[facility] Emergency Department Provider Note   ____________________________________________   First MD Initiated Contact with Patient 08/05/20 0245     (approximate)  I have reviewed the triage vital signs and the nursing notes.   HISTORY  Chief Complaint Nausea and Abdominal Pain    HPI Billy Crosby is a 59 y.o. male who presents to the ED from home with a chief complaint of fever, headache, abdominal pain, nausea, decreased appetite, cough and fatigue.  Patient is unvaccinated against COVID-19.  His wife tested positive recently. Reports a 1 week history of the above symptoms.  States he cannot eat or drink and has had 1 Ensure a day for his nutrition.  Denies chest pain, shortness of breath, diarrhea.      Past Medical History:  Diagnosis Date  . Headache    Tension/sinus/stress 6-7x/wk    Patient Active Problem List   Diagnosis Date Noted  . Special screening for malignant neoplasms, colon   . Benign neoplasm of transverse colon     Past Surgical History:  Procedure Laterality Date  . COLONOSCOPY WITH PROPOFOL N/A 08/23/2016   Procedure: COLONOSCOPY WITH PROPOFOL;  Surgeon: Lucilla Lame, MD;  Location: [place];  Service: Endoscopy;  Laterality: N/A;  requests early  . INGUINAL HERNIA REPAIR Right 2011  . MICRODISCECTOMY LUMBAR  1990s   L4-L5 (x2)  . POLYPECTOMY  08/23/2016   Procedure: POLYPECTOMY;  Surgeon: Lucilla Lame, MD;  Location: [place];  Service: Endoscopy;;  . UMBILICAL HERNIA REPAIR  2011, 2016    Prior to Admission medications   Medication Sig Start Date End Date Taking? Authorizing Provider  albuterol (VENTOLIN HFA) 108 (90 Base) MCG/ACT inhaler Inhale 2 puffs into the lungs every 4 (four) hours as needed for wheezing or shortness of breath. 08/05/20   Paulette Blanch, MD  azelastine (ASTELIN) 0.1 % nasal spray Place 1 spray into both nostrils 2 (two) times daily. Use in each nostril as directed     [provider]  chlorpheniramine-HYDROcodone (TUSSIONEX PENNKINETIC ER) 10-8 MG/5ML SUER Take 5 mLs by mouth 2 (two) times daily. 08/05/20   Paulette Blanch, MD  Cholecalciferol (VITAMIN D3 PO) Take by mouth.    [provider]  fluticasone (FLONASE) 50 MCG/ACT nasal spray Place into both nostrils daily.    [provider]  GARLIC OIL PO Take by mouth.    [provider]  Menaquinone-7 (VITAMIN K2 PO) Take by mouth.    [provider]  NON FORMULARY "Skull cap" (Scutellarai [place]iaflora) for headaches PRN    [provider]  OIL OF OREGANO PO Take by mouth.    [provider]  ondansetron (ZOFRAN ODT) 4 MG disintegrating tablet Take 1 tablet (4 mg total) by mouth every 8 (eight) hours as needed for nausea or vomiting. 08/05/20   Paulette Blanch, MD  oxyCODONE-acetaminophen (PERCOCET/ROXICET) 5-325 MG tablet Take 1 tablet by mouth every 4 (four) hours as needed for severe pain. 08/05/20   Paulette Blanch, MD  Probiotic Product (PROBIOTIC PO) Take by mouth.    [provider]  topiramate (TOPAMAX) 25 MG tablet Take 25 mg by mouth 2 (two) times daily.    [provider]    Allergies Levaquin [levofloxacin]  History reviewed. No pertinent family history.  Social History Social History   Tobacco Use  . Smoking status: Never Smoker  . Smokeless tobacco: Never Used  Substance Use Topics  . Alcohol use: Yes  Comment: Holidays  . Drug use: Not on file    Review of Systems  Constitutional: Positive for fever and fatigue Eyes: No visual changes. ENT: No sore throat. Cardiovascular: Denies chest pain. Respiratory: Positive for cough.  Denies shortness of breath. Gastrointestinal: Positive for abdominal pain and nausea, no vomiting.  No diarrhea.  No constipation. Genitourinary: Negative for dysuria. Musculoskeletal: Negative for back pain. Skin: Negative for rash. Neurological: Positive for headaches.  Negative for  focal weakness or numbness.   ____________________________________________   PHYSICAL EXAM:  VITAL SIGNS: ED Triage Vitals  Enc Vitals Group     BP 08/04/20 2058 123/79     Pulse Rate 08/04/20 2058 95     Resp 08/04/20 2058 18     Temp 08/04/20 2058 (!) 101.9 F (38.8 C)     Temp Source 08/04/20 2058 Oral     SpO2 08/04/20 2058 100 %     Weight 08/04/20 2100 215 lb (97.5 kg)     Height 08/04/20 2100 5\' 11"  (1.803 m)     Head Circumference --      Peak Flow --      Pain Score 08/04/20 2058 8     Pain Loc --      Pain Edu? --      Excl. in [place]? --     Constitutional: Alert and oriented.  Tired appearing and in no acute distress. Eyes: Conjunctivae are normal. PERRL. EOMI. Head: Atraumatic. Nose: No congestion/rhinnorhea. Mouth/Throat: Mucous membranes are mildly dry.   Neck: No stridor.   Cardiovascular: Normal rate, regular rhythm. Grossly normal heart sounds.  Good peripheral circulation. Respiratory: Normal respiratory effort.  No retractions. Lungs diminished bibasilarly. Gastrointestinal: Soft and nontender to light or deep palpation. No distention. No abdominal bruits. No CVA tenderness. Musculoskeletal: No lower extremity tenderness nor edema.  No joint effusions. Neurologic:  Normal speech and language. No gross focal neurologic deficits are appreciated. No gait instability. Skin:  Skin is warm, dry and intact. No rash noted.  No petechiae. Psychiatric: Mood and affect are normal. Speech and behavior are normal.  ____________________________________________   LABS (all labs ordered are listed, but only abnormal results are displayed)  Labs Reviewed  SARS CORONAVIRUS 2 BY RT PCR (HOSPITAL ORDER, [facility]) - Abnormal; Notable for the following components:      Result Value   SARS Coronavirus 2 POSITIVE (*)    All other components within normal limits  LIPASE, BLOOD - Abnormal; Notable for the following components:   Lipase 90 (*)      All other components within normal limits  COMPREHENSIVE METABOLIC PANEL - Abnormal; Notable for the following components:   Glucose, Bld 118 (*)    Calcium 8.5 (*)    AST 103 (*)    ALT 88 (*)    All other components within normal limits  CBC - Abnormal; Notable for the following components:   WBC 3.3 (*)    Platelets 111 (*)    All other components within normal limits  URINALYSIS, COMPLETE (UACMP) WITH MICROSCOPIC   ____________________________________________  EKG  ED ECG REPORT I, Wretha Laris J, the attending physician, personally viewed and interpreted this ECG.   Date: 08/05/2020  EKG Time: 2107  Rate: 98  Rhythm: normal EKG, normal sinus rhythm  Axis: Normal  Intervals:none  ST&T Change: Nonspecific  ____________________________________________  RADIOLOGY  ED MD interpretation: Patchy bilateral opacities consistent with COVID-19 pneumonia; cholelithiasis without cholecystitis  Official radiology report(s): DG Chest 2  View  Result Date: 08/05/2020 CLINICAL DATA:  Cough EXAM: CHEST - 2 VIEW COMPARISON:  None. FINDINGS: Patchy peripheral ground-glass airspace opacities in the right mid and lower lung. Left basilar infiltrate. No effusions. Heart is normal size. No acute bony abnormality. IMPRESSION: Patchy bilateral opacities concerning for pneumonia. Electronically Signed   By: Rolm Baptise M.D.   On: 08/05/2020 00:11   US ABDOMEN LIMITED RUQ  Result Date: 08/05/2020 CLINICAL DATA:  COVID positive. Nausea. Elevated liver function studies. EXAM: ULTRASOUND ABDOMEN LIMITED RIGHT UPPER QUADRANT COMPARISON:  None. FINDINGS: Gallbladder: Cholelithiasis with 11 mm stone in the gallbladder neck. No gallbladder wall thickening or edema. Murphy's sign is negative. Common bile duct: Diameter: 4 mm, normal Liver: Diffusely increased parenchymal echotexture suggesting fatty infiltration. No focal lesions are identified. Portal vein is patent on color Doppler imaging with normal  direction of blood flow towards the liver. Other: Incidental note of a cyst in the lower pole of the right kidney. IMPRESSION: 1. Cholelithiasis with 11 mm stone in the gallbladder neck. No additional changes to suggest acute cholecystitis. 2. Diffuse fatty infiltration of the liver. Electronically Signed   By: Lucienne Capers M.D.   On: 08/05/2020 04:33    ____________________________________________   PROCEDURES  Procedure(s) performed (including Critical Care):  Procedures   ____________________________________________   INITIAL IMPRESSION / ASSESSMENT AND PLAN / ED COURSE  As part of my medical decision making, I reviewed the following data within the [facility] [facility] notes reviewed and incorporated, Labs reviewed, EKG interpreted, Old chart reviewed, Radiograph reviewed and Notes from prior ED visits     Gillian Kluever was evaluated in Emergency Department on 08/05/2020 for the symptoms described in the history of present illness. He was evaluated in the context of the global COVID-19 pandemic, which necessitated consideration that the patient might be at risk for infection with the SARS-CoV-2 virus that causes COVID-19. Institutional protocols and algorithms that pertain to the evaluation of patients at risk for COVID-19 are in a state of rapid change based on information released by regulatory bodies including the CDC and federal and state organizations. These policies and algorithms were followed during the patient's care in the ED.    59 year old male presenting with fever, fatigue, cough, abdominal pain, nausea. Differential diagnosis includes, but is not limited to, viral process such as COVID-19, biliary disease (biliary colic, acute cholecystitis, cholangitis, choledocholithiasis, etc), intrathoracic causes for epigastric abdominal pain including ACS, gastritis, duodenitis, pancreatitis, small bowel or large bowel obstruction, abdominal aortic aneurysm,  hernia, and ulcer(s).  Laboratory results remarkable for mild elevation of transaminases, mild elevation of lipase, Covid positive.  Will initiate IV fluids, IV Zofran, ultrasound to evaluate for cholecystitis.  Will reassess.   Clinical Course as of Aug 05 657  Fri Aug 05, 2020  3825 IV fluids infusing.  Ambulation trial unremarkable, no hypoxia nor tachypnea.  Updated patient of all results.  Spouse also Covid positive he was hospitalized.  We discussed hospitalization versus discharge home.  Patient adamantly desires to be discharged home.  Will refer him to the antibody infusion clinic and general surgery for follow-up cholelithiasis.  Will discharge home on Percocet and Zofran to use as needed for cholelithiasis, Tussionex for cough, albuterol inhaler.  Strict return precautions given.  Patient verbalizes understanding and agrees with plan of care.   [JS]    Clinical Course User Index [JS] Paulette Blanch, MD     ____________________________________________   FINAL CLINICAL IMPRESSION(S) / ED DIAGNOSES  Final diagnoses:  Pneumonia due to COVID-19 virus  Nausea  Fever, unspecified fever cause  Calculus of gallbladder without cholecystitis without obstruction     ED Discharge Orders         Ordered    oxyCODONE-acetaminophen (PERCOCET/ROXICET) 5-325 MG tablet  Every 4 hours PRN        08/05/20 0619    ondansetron (ZOFRAN ODT) 4 MG disintegrating tablet  Every 8 hours PRN        08/05/20 0619    albuterol (VENTOLIN HFA) 108 (90 Base) MCG/ACT inhaler  Every 4 hours PRN        08/05/20 0619    chlorpheniramine-HYDROcodone (TUSSIONEX PENNKINETIC ER) 10-8 MG/5ML SUER  2 times daily        08/05/20 3818          *Please note:  Banks Chaikin was evaluated in Emergency Department on 08/05/2020 for the symptoms described in the history of present illness. He was evaluated in the context of the global COVID-19 pandemic, which necessitated consideration that the patient might  be at risk for infection with the SARS-CoV-2 virus that causes COVID-19. Institutional protocols and algorithms that pertain to the evaluation of patients at risk for COVID-19 are in a state of rapid change based on information released by regulatory bodies including the CDC and federal and state organizations. These policies and algorithms were followed during the patient's care in the ED.  Some ED evaluations and interventions may be delayed as a result of limited staffing during and the pandemic.*   Note:  This document was prepared using Dragon voice recognition software and may include unintentional dictation errors.   Paulette Blanch, MD 08/05/20 [phone]

## 2020-08-05 NOTE — ED Notes (Signed)
Provided pt with urinal

## 2020-08-05 NOTE — Discharge Instructions (Addendum)
1.  You may use albuterol inhaler 2 puffs every 4 hours as needed for cough/difficulty breathing. 2.  You may take Tussionex as needed for cough. 3.  Alternate Tylenol and Ibuprofen every 4 hours as needed for fever greater than 100.4 F. 4.  You may take Percocet and Zofran as needed for pain and nausea. 5.  Eat a bland diet and drink plenty of fluids daily. 6.  Return to the ER for worsening symptoms, persistent vomiting, difficulty breathing or other concerns.

## 2020-08-06 ENCOUNTER — Ambulatory Visit (HOSPITAL_COMMUNITY): Payer: Managed Care, Other (non HMO)

## 2020-08-06 ENCOUNTER — Telehealth: Payer: Self-pay | Admitting: Physician Assistant

## 2020-08-06 NOTE — Telephone Encounter (Signed)
Patient called and stated the he is feeling much better this morning and asymptomatic. He wishes to cancel his infusion later today.

## 2021-09-06 ENCOUNTER — Telehealth: Payer: Self-pay

## 2021-09-06 ENCOUNTER — Other Ambulatory Visit: Payer: Self-pay

## 2021-09-06 DIAGNOSIS — Z860100 Personal history of colon polyps, unspecified: Secondary | ICD-10-CM

## 2021-09-06 MED ORDER — PEG 3350-KCL-NA BICARB-NACL 420 G PO SOLR: 4000.0000 mL | Freq: Once | ORAL | 0 refills | Status: AC

## 2021-09-06 NOTE — Progress Notes (Signed)
Gastroenterology Pre-Procedure Review  Request Date: 09/19/2021 Requesting Physician: Dr. Allen Norris  PATIENT REVIEW QUESTIONS: The patient responded to the following health history questions as indicated:    1. Are you having any GI issues? no 2. Do you have a personal history of Polyps? yes (removed) 3. Do you have a family history of Colon Cancer or Polyps? no 4. Diabetes Mellitus? no 5. Joint replacements in the past 12 months?no 6. Major health problems in the past 3 months?no 7. Any artificial heart valves, MVP, or defibrillator?no    MEDICATIONS & ALLERGIES:    Patient reports the following regarding taking any anticoagulation/antiplatelet therapy:   Plavix, Coumadin, Eliquis, Xarelto, Lovenox, Pradaxa, Brilinta, or Effient? no Aspirin? no  Patient confirms/reports the following medications:  Current Outpatient Medications  Medication Sig Dispense Refill   ondansetron (ZOFRAN ODT) 4 MG disintegrating tablet Take 1 tablet (4 mg total) by mouth every 8 (eight) hours as needed for nausea or vomiting. 20 tablet 0   No current facility-administered medications for this visit.    Patient confirms/reports the following allergies:  Allergies  Allergen Reactions   Levaquin [Levofloxacin] Swelling    Tongue and mouth swells     No orders of the defined types were placed in this encounter.   AUTHORIZATION INFORMATION Primary Insurance: 1D#: Group #:  Secondary Insurance: 1D#: Group #:  SCHEDULE INFORMATION: Date: 09/19/2021 Time: Location: [facility]

## 2021-09-06 NOTE — Telephone Encounter (Signed)
Pt. Calling to schedule colonoscopy.Marland Kitchen He is due for his 4 year

## 2021-09-07 ENCOUNTER — Telehealth: Payer: Self-pay

## 2021-09-07 NOTE — Telephone Encounter (Signed)
Spoke with patient and due to insurance he would like to cancel procedure. Endo unit has been notified of this change.

## 2021-09-07 NOTE — Telephone Encounter (Signed)
Patient is calling to cancel colonoscopy because of the cost of colonoscopy. He does not want to reschedule at this time

## 2021-09-19 ENCOUNTER — Ambulatory Visit
Admission: RE | Admit: 2021-09-19 | Payer: Managed Care, Other (non HMO) | Source: Home / Self Care | Admitting: Gastroenterology

## 2021-09-19 ENCOUNTER — Encounter: Admission: RE | Payer: Self-pay | Source: Home / Self Care

## 2021-09-19 SURGERY — COLONOSCOPY WITH PROPOFOL
Anesthesia: General
